# Patient Record
Sex: Male | Born: 1980 | Race: White | Hispanic: No | Marital: Single | Smoking: Former smoker
Health system: Southern US, Community
[De-identification: ages and names within clinical notes are randomized; demographics above are authoritative.]

## PROBLEM LIST (undated history)

## (undated) DIAGNOSIS — F419 Anxiety disorder, unspecified: Secondary | ICD-10-CM

## (undated) DIAGNOSIS — F32A Depression, unspecified: Secondary | ICD-10-CM

## (undated) DIAGNOSIS — F329 Major depressive disorder, single episode, unspecified: Secondary | ICD-10-CM

## (undated) DIAGNOSIS — F319 Bipolar disorder, unspecified: Secondary | ICD-10-CM

## (undated) DIAGNOSIS — I8390 Asymptomatic varicose veins of unspecified lower extremity: Secondary | ICD-10-CM

## (undated) HISTORY — DX: Asymptomatic varicose veins of unspecified lower extremity: I83.90

## (undated) HISTORY — DX: Bipolar disorder, unspecified: F31.9

## (undated) HISTORY — DX: Anxiety disorder, unspecified: F41.9

## (undated) HISTORY — DX: Depression, unspecified: F32.A

## (undated) HISTORY — PX: VARICOSE VEIN SURGERY: SHX832

## (undated) HISTORY — PX: KNEE SURGERY: SHX244

## (undated) HISTORY — DX: Major depressive disorder, single episode, unspecified: F32.9

---
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
## 2006-10-02 ENCOUNTER — Emergency Department: Payer: Self-pay | Admitting: Emergency Medicine

## 2007-04-11 ENCOUNTER — Other Ambulatory Visit: Payer: Self-pay

## 2007-04-11 ENCOUNTER — Emergency Department: Payer: Self-pay | Admitting: Emergency Medicine

## 2008-09-03 ENCOUNTER — Emergency Department: Payer: Self-pay | Admitting: Emergency Medicine
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
## 2011-10-09 ENCOUNTER — Ambulatory Visit (INDEPENDENT_AMBULATORY_CARE_PROVIDER_SITE_OTHER): Payer: 59 | Admitting: Psychiatry

## 2011-10-09 ENCOUNTER — Encounter (HOSPITAL_COMMUNITY): Payer: Self-pay | Admitting: Psychiatry

## 2011-10-09 VITALS — BP 130/90 | HR 82 | Wt 258.0 lb

## 2011-10-09 DIAGNOSIS — F3111 Bipolar disorder, current episode manic without psychotic features, mild: Secondary | ICD-10-CM | POA: Insufficient documentation

## 2011-10-09 DIAGNOSIS — F4310 Post-traumatic stress disorder, unspecified: Secondary | ICD-10-CM

## 2011-10-09 DIAGNOSIS — F1994 Other psychoactive substance use, unspecified with psychoactive substance-induced mood disorder: Secondary | ICD-10-CM

## 2011-10-09 DIAGNOSIS — F1910 Other psychoactive substance abuse, uncomplicated: Secondary | ICD-10-CM | POA: Insufficient documentation

## 2011-10-09 MED ORDER — ARIPIPRAZOLE 2 MG PO TABS
ORAL_TABLET | ORAL | Status: DC
Start: 2011-10-09 — End: 2011-10-23
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
## 2011-10-23 ENCOUNTER — Encounter (HOSPITAL_COMMUNITY): Payer: Self-pay | Admitting: Psychiatry

## 2011-10-23 ENCOUNTER — Ambulatory Visit (INDEPENDENT_AMBULATORY_CARE_PROVIDER_SITE_OTHER): Payer: 59 | Admitting: Psychiatry

## 2011-10-23 VITALS — Wt 264.0 lb

## 2011-10-23 DIAGNOSIS — F1910 Other psychoactive substance abuse, uncomplicated: Secondary | ICD-10-CM

## 2011-10-23 DIAGNOSIS — F988 Other specified behavioral and emotional disorders with onset usually occurring in childhood and adolescence: Secondary | ICD-10-CM

## 2011-10-23 DIAGNOSIS — F1994 Other psychoactive substance use, unspecified with psychoactive substance-induced mood disorder: Secondary | ICD-10-CM

## 2011-10-23 DIAGNOSIS — F319 Bipolar disorder, unspecified: Secondary | ICD-10-CM

## 2011-10-23 DIAGNOSIS — F3111 Bipolar disorder, current episode manic without psychotic features, mild: Secondary | ICD-10-CM

## 2011-10-23 MED ORDER — ATOMOXETINE HCL 18 MG PO CAPS: 18.0000 mg | ORAL_CAPSULE | Freq: Every day | ORAL | Status: DC

## 2011-10-23 MED ORDER — ARIPIPRAZOLE 5 MG PO TABS: 5.0000 mg | ORAL_TABLET | Freq: Two times a day (BID) | ORAL | Status: DC
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
## 2011-11-13 ENCOUNTER — Ambulatory Visit (HOSPITAL_COMMUNITY): Payer: Self-pay | Admitting: Psychiatry

## 2011-11-17 ENCOUNTER — Telehealth (HOSPITAL_COMMUNITY): Payer: Self-pay | Admitting: *Deleted

## 2011-11-17 ENCOUNTER — Other Ambulatory Visit (HOSPITAL_COMMUNITY): Payer: Self-pay | Admitting: Psychiatry

## 2011-11-18 ENCOUNTER — Other Ambulatory Visit (HOSPITAL_COMMUNITY): Payer: Self-pay | Admitting: Psychiatry

## 2011-11-18 NOTE — Telephone Encounter (Signed)
Call returned and left a message 

## 2011-11-25 ENCOUNTER — Ambulatory Visit (HOSPITAL_COMMUNITY): Payer: Self-pay | Admitting: Psychiatry

## 2011-12-03 ENCOUNTER — Ambulatory Visit (HOSPITAL_COMMUNITY): Payer: Self-pay | Admitting: Psychology

## 2012-03-15 ENCOUNTER — Telehealth (HOSPITAL_COMMUNITY): Payer: Self-pay

## 2012-10-12 DIAGNOSIS — F1020 Alcohol dependence, uncomplicated: Secondary | ICD-10-CM | POA: Insufficient documentation

## 2012-10-12 DIAGNOSIS — F1420 Cocaine dependence, uncomplicated: Secondary | ICD-10-CM | POA: Insufficient documentation

## 2015-05-30 ENCOUNTER — Ambulatory Visit (INDEPENDENT_AMBULATORY_CARE_PROVIDER_SITE_OTHER): Payer: 59 | Admitting: Family Medicine

## 2015-05-30 ENCOUNTER — Encounter: Payer: Self-pay | Admitting: Family Medicine

## 2015-05-30 VITALS — BP 120/80 | HR 80 | Ht 70.0 in | Wt 251.0 lb

## 2015-05-30 DIAGNOSIS — Z0000 Encounter for general adult medical examination without abnormal findings: Secondary | ICD-10-CM | POA: Diagnosis not present

## 2015-05-30 DIAGNOSIS — E663 Overweight: Secondary | ICD-10-CM | POA: Diagnosis not present

## 2015-05-30 DIAGNOSIS — F3111 Bipolar disorder, current episode manic without psychotic features, mild: Secondary | ICD-10-CM | POA: Diagnosis not present

## 2015-05-30 NOTE — Progress Notes (Signed)
Name: Edward Long   MRN: [id number]    DOB: 09-02-1980   Date:05/30/2015       Progress Note  Subjective  Chief Complaint  Chief Complaint  Patient presents with  . Establish Care  . Referral    needs referral to psych- been off med x 2 1/2 years    HPI Comments: Patient presents for annual physical exam.     No problem-specific assessment & plan notes found for this encounter.   Past Medical History  Diagnosis Date  . Varicose vein     Past Surgical History  Procedure Laterality Date  . Knee surgery    . Varicose vein surgery      Family History  Problem Relation Age of Onset  . Anxiety disorder Mother   . Bipolar disorder Father   . Anxiety disorder Brother     Social History   Social History  . Marital Status: Single    Spouse Name: N/A  . Number of Children: N/A  . Years of Education: N/A   Occupational History  . Not on file.   Social History Main Topics  . Smoking status: Current Some Day Smoker  . Smokeless tobacco: Not on file  . Alcohol Use: Yes  . Drug Use: Yes  . Sexual Activity: Not on file   Other Topics Concern  . Not on file   Social History Narrative    Allergies  Allergen Reactions  . Penicillins Rash     Review of Systems  Constitutional: Negative for fever, chills, weight loss and malaise/fatigue.  HENT: Negative for ear discharge, ear pain and sore throat.   Eyes: Negative for blurred vision.  Respiratory: Negative for cough, sputum production, shortness of breath and wheezing.   Cardiovascular: Negative for chest pain, palpitations and leg swelling.  Gastrointestinal: Negative for heartburn, nausea, abdominal pain, diarrhea, constipation, blood in stool and melena.  Genitourinary: Negative for dysuria, urgency, frequency and hematuria.  Musculoskeletal: Negative for myalgias, back pain, joint pain and neck pain.  Skin: Negative for rash.  Neurological: Negative for dizziness, tingling, sensory change, focal weakness  and headaches.  Endo/Heme/Allergies: Negative for environmental allergies and polydipsia. Does not bruise/bleed easily.  Psychiatric/Behavioral: Negative for depression and suicidal ideas. The patient is not nervous/anxious and does not have insomnia.      Objective  Filed Vitals:   05/30/15 0928  BP: 120/80  Pulse: 80  Height: 5\' 10"  (1.778 m)  Weight: 251 lb (113.853 kg)    Physical Exam  Constitutional: He is oriented to person, place, and time and well-developed, well-nourished, and in no distress.  HENT:  Head: Normocephalic.  Right Ear: Tympanic membrane, external ear and ear canal normal.  Left Ear: Tympanic membrane, external ear and ear canal normal.  Nose: Nose normal.  Mouth/Throat: Uvula is midline, oropharynx is clear and moist and mucous membranes are normal.  Eyes: Conjunctivae and EOM are normal. Pupils are equal, round, and reactive to light. Right eye exhibits no discharge. Left eye exhibits no discharge. No scleral icterus.  Neck: Normal range of motion. Neck supple. No JVD present. No tracheal deviation present. No thyromegaly present.  Cardiovascular: Normal rate, regular rhythm, normal heart sounds and intact distal pulses.  Exam reveals no gallop and no friction rub.   No murmur heard. Pulmonary/Chest: Breath sounds normal. No respiratory distress. He has no wheezes. He has no rales. Right breast exhibits no inverted nipple, no mass, no nipple discharge, no skin change and no tenderness. Left breast  exhibits no inverted nipple, no mass, no nipple discharge, no skin change and no tenderness. Breasts are symmetrical.  Abdominal: Soft. Bowel sounds are normal. He exhibits no mass. There is no hepatosplenomegaly. There is no tenderness. There is no rebound, no guarding and no CVA tenderness.  Genitourinary: Testes/scrotum normal.  Musculoskeletal: Normal range of motion. He exhibits no edema or tenderness.  Lymphadenopathy:       Head (right side): No submental and  no submandibular adenopathy present.       Head (left side): No submental and no submandibular adenopathy present.    He has no cervical adenopathy.  Neurological: He is alert and oriented to person, place, and time. He has normal sensation, normal strength, normal reflexes and intact cranial nerves. No cranial nerve deficit.  Skin: Skin is warm. No rash noted.  Psychiatric: Affect normal. His mood appears anxious. His affect is not blunt, not labile and not inappropriate. He is not agitated. He does not express impulsivity. He does not exhibit a depressed mood. He expresses no homicidal and no suicidal ideation. He expresses no suicidal plans and no homicidal plans. He is not apathetic. He exhibits disordered thought content. He does not have a flat affect.  Nursing note and vitals reviewed.     Assessment & Plan  Problem List Items Addressed This Visit      Other   Bipolar 1 disorder, manic, mild (HCC)   Relevant Orders   Ambulatory referral to Psychiatry    Other Visit Diagnoses    Annual physical exam    -  Primary    Relevant Orders    Renal Function Panel    Lipid Profile    Overweight        Relevant Orders    Renal Function Panel    Lipid Profile         Dr. Elizabeth Sauer [facility]  05/30/2015

## 2015-05-31 LAB — RENAL FUNCTION PANEL
ALBUMIN: 4 g/dL (ref 3.5–5.5)
BUN / CREAT RATIO: 12 (ref 9–20)
BUN: 12 mg/dL (ref 6–20)
CALCIUM: 9 mg/dL (ref 8.7–10.2)
CHLORIDE: 101 mmol/L (ref 96–106)
CO2: 26 mmol/L (ref 18–29)
Creatinine, Ser: 1.01 mg/dL (ref 0.76–1.27)
GFR calc non Af Amer: 98 mL/min/{1.73_m2} (ref >59)
GFR, EST AFRICAN AMERICAN: 113 mL/min/{1.73_m2} (ref >59)
GLUCOSE: 98 mg/dL (ref 65–99)
POTASSIUM: 4.2 mmol/L (ref 3.5–5.2)
Phosphorus: 3 mg/dL (ref 2.5–4.5)
Sodium: 140 mmol/L (ref 134–144)

## 2015-05-31 LAB — LIPID PANEL
CHOLESTEROL TOTAL: 135 mg/dL (ref 100–199)
Chol/HDL Ratio: 2.6 ratio units (ref 0.0–5.0)
HDL: 51 mg/dL (ref >39)
LDL Calculated: 74 mg/dL (ref 0–99)
Triglycerides: 51 mg/dL (ref 0–149)
VLDL Cholesterol Cal: 10 mg/dL (ref 5–40)

## 2015-06-29 ENCOUNTER — Encounter: Payer: Self-pay | Admitting: Psychiatry

## 2015-06-29 ENCOUNTER — Ambulatory Visit (INDEPENDENT_AMBULATORY_CARE_PROVIDER_SITE_OTHER): Payer: 59 | Admitting: Psychiatry

## 2015-06-29 VITALS — BP 110/78 | HR 75 | Temp 97.3°F | Ht 70.0 in | Wt 254.0 lb

## 2015-06-29 DIAGNOSIS — E669 Obesity, unspecified: Secondary | ICD-10-CM | POA: Insufficient documentation

## 2015-06-29 DIAGNOSIS — F3160 Bipolar disorder, current episode mixed, unspecified: Secondary | ICD-10-CM

## 2015-06-29 DIAGNOSIS — F1414 Cocaine abuse with cocaine-induced mood disorder: Secondary | ICD-10-CM | POA: Diagnosis not present

## 2015-06-29 DIAGNOSIS — F988 Other specified behavioral and emotional disorders with onset usually occurring in childhood and adolescence: Secondary | ICD-10-CM | POA: Insufficient documentation

## 2015-06-29 DIAGNOSIS — F32A Depression, unspecified: Secondary | ICD-10-CM | POA: Insufficient documentation

## 2015-06-29 DIAGNOSIS — F1010 Alcohol abuse, uncomplicated: Secondary | ICD-10-CM

## 2015-06-29 DIAGNOSIS — F329 Major depressive disorder, single episode, unspecified: Secondary | ICD-10-CM | POA: Insufficient documentation

## 2015-06-29 MED ORDER — LITHIUM CARBONATE 300 MG PO TABS: ORAL_TABLET | ORAL | Status: DC

## 2015-06-29 MED ORDER — BUSPIRONE HCL 5 MG PO TABS: 5.0000 mg | ORAL_TABLET | Freq: Two times a day (BID) | ORAL | Status: DC
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
## 2015-07-13 ENCOUNTER — Encounter: Payer: Self-pay | Admitting: Psychiatry

## 2015-07-13 ENCOUNTER — Ambulatory Visit (INDEPENDENT_AMBULATORY_CARE_PROVIDER_SITE_OTHER): Payer: 59 | Admitting: Psychiatry

## 2015-07-13 VITALS — BP 124/86 | HR 84 | Temp 97.2°F | Ht 70.0 in | Wt 253.2 lb

## 2015-07-13 DIAGNOSIS — F1414 Cocaine abuse with cocaine-induced mood disorder: Secondary | ICD-10-CM

## 2015-07-13 DIAGNOSIS — F3160 Bipolar disorder, current episode mixed, unspecified: Secondary | ICD-10-CM | POA: Diagnosis not present

## 2015-07-13 DIAGNOSIS — F1010 Alcohol abuse, uncomplicated: Secondary | ICD-10-CM

## 2015-07-13 MED ORDER — BUSPIRONE HCL 10 MG PO TABS: 10.0000 mg | ORAL_TABLET | Freq: Two times a day (BID) | ORAL | Status: DC

## 2015-07-13 MED ORDER — LITHIUM CARBONATE 300 MG PO TABS: 300.0000 mg | ORAL_TABLET | Freq: Two times a day (BID) | ORAL | Status: DC
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
## 2015-07-27 ENCOUNTER — Encounter: Payer: Self-pay | Admitting: Psychiatry

## 2015-07-27 ENCOUNTER — Ambulatory Visit (INDEPENDENT_AMBULATORY_CARE_PROVIDER_SITE_OTHER): Payer: 59 | Admitting: Licensed Clinical Social Worker

## 2015-07-27 ENCOUNTER — Ambulatory Visit (INDEPENDENT_AMBULATORY_CARE_PROVIDER_SITE_OTHER): Payer: 59 | Admitting: Psychiatry

## 2015-07-27 VITALS — BP 118/84 | HR 87 | Temp 97.2°F | Ht 70.0 in | Wt 253.6 lb

## 2015-07-27 DIAGNOSIS — F3160 Bipolar disorder, current episode mixed, unspecified: Secondary | ICD-10-CM

## 2015-07-27 DIAGNOSIS — F429 Obsessive-compulsive disorder, unspecified: Secondary | ICD-10-CM

## 2015-07-27 DIAGNOSIS — F1414 Cocaine abuse with cocaine-induced mood disorder: Secondary | ICD-10-CM | POA: Diagnosis not present

## 2015-07-27 MED ORDER — LITHIUM CARBONATE ER 300 MG PO TBCR: 300.0000 mg | EXTENDED_RELEASE_TABLET | Freq: Three times a day (TID) | ORAL | Status: DC

## 2015-07-27 MED ORDER — QUETIAPINE FUMARATE 100 MG PO TABS: 100.0000 mg | ORAL_TABLET | Freq: Every day | ORAL | Status: DC

## 2015-07-27 MED ORDER — BUSPIRONE HCL 10 MG PO TABS: 10.0000 mg | ORAL_TABLET | Freq: Three times a day (TID) | ORAL | Status: DC
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
## 2015-08-24 ENCOUNTER — Ambulatory Visit: Payer: 59 | Admitting: Psychiatry

## 2015-08-24 ENCOUNTER — Ambulatory Visit: Payer: 59 | Admitting: Licensed Clinical Social Worker

## 2015-12-14 ENCOUNTER — Encounter: Payer: Self-pay | Admitting: Psychiatry

## 2015-12-14 ENCOUNTER — Ambulatory Visit (INDEPENDENT_AMBULATORY_CARE_PROVIDER_SITE_OTHER): Payer: 59 | Admitting: Psychiatry

## 2015-12-14 VITALS — BP 118/83 | HR 64 | Temp 98.4°F | Wt 239.4 lb

## 2015-12-14 DIAGNOSIS — F1414 Cocaine abuse with cocaine-induced mood disorder: Secondary | ICD-10-CM

## 2015-12-14 MED ORDER — QUETIAPINE FUMARATE 25 MG PO TABS: 25.0000 mg | ORAL_TABLET | Freq: Every day | ORAL | 1 refills | Status: DC

## 2015-12-14 MED ORDER — QUETIAPINE FUMARATE 100 MG PO TABS: 100.0000 mg | ORAL_TABLET | Freq: Every day | ORAL | 1 refills | Status: DC
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
## 2015-12-19 ENCOUNTER — Ambulatory Visit: Payer: 59 | Admitting: Licensed Clinical Social Worker

## 2015-12-20 ENCOUNTER — Ambulatory Visit: Payer: 59 | Admitting: Licensed Clinical Social Worker

## 2015-12-24 ENCOUNTER — Ambulatory Visit: Payer: 59 | Admitting: Licensed Clinical Social Worker

## 2016-04-22 ENCOUNTER — Other Ambulatory Visit: Payer: Self-pay | Admitting: Orthopedic Surgery

## 2016-04-22 DIAGNOSIS — M25561 Pain in right knee: Principal | ICD-10-CM

## 2016-04-22 DIAGNOSIS — G8929 Other chronic pain: Secondary | ICD-10-CM

## 2016-04-22 DIAGNOSIS — M2351 Chronic instability of knee, right knee: Secondary | ICD-10-CM

## 2016-04-22 DIAGNOSIS — M1711 Unilateral primary osteoarthritis, right knee: Secondary | ICD-10-CM

## 2016-04-22 DIAGNOSIS — Z9889 Other specified postprocedural states: Secondary | ICD-10-CM

## 2016-05-02 ENCOUNTER — Ambulatory Visit: Admission: RE | Admit: 2016-05-02 | Payer: Commercial Managed Care - PPO | Source: Ambulatory Visit

## 2016-09-13 ENCOUNTER — Emergency Department: Payer: Commercial Managed Care - PPO

## 2016-09-13 ENCOUNTER — Encounter: Payer: Self-pay | Admitting: Emergency Medicine

## 2016-09-13 DIAGNOSIS — G44019 Episodic cluster headache, not intractable: Secondary | ICD-10-CM | POA: Diagnosis not present

## 2016-09-13 DIAGNOSIS — F17220 Nicotine dependence, chewing tobacco, uncomplicated: Secondary | ICD-10-CM | POA: Insufficient documentation

## 2016-09-13 DIAGNOSIS — F430 Acute stress reaction: Secondary | ICD-10-CM | POA: Insufficient documentation

## 2016-09-13 DIAGNOSIS — R51 Headache: Secondary | ICD-10-CM | POA: Diagnosis present

## 2016-09-13 NOTE — ED Triage Notes (Signed)
Patient with complaint of intermittent headache times one year. Patient reports that tonight he started having numbness to his jaw and neck that started around 17:00 tonight. Patient states that the numbness has mostly resolved at this time.

## 2016-09-13 NOTE — ED Triage Notes (Signed)
Pt says he's been having headaches about 3 times a week for over one year; pain is always to the right temple to above right ear; pt says today he also had numbness to his jaw but that has resolved; pt adds he's been having strange thoughts, not bad thoughts like hurting himself or someone else; just thoughts about things he hears or things that bother him that usually wouldn't; pt would like a head CT

## 2016-09-14 ENCOUNTER — Emergency Department
Admission: EM | Admit: 2016-09-14 | Discharge: 2016-09-14 | Disposition: A | Discharge status: Home / Self Care | Payer: Commercial Managed Care - PPO | Attending: Emergency Medicine | Admitting: Emergency Medicine

## 2016-09-14 DIAGNOSIS — F439 Reaction to severe stress, unspecified: Secondary | ICD-10-CM

## 2016-09-14 DIAGNOSIS — G44019 Episodic cluster headache, not intractable: Secondary | ICD-10-CM

## 2016-09-14 LAB — CBC WITH DIFFERENTIAL/PLATELET
Basophils Absolute: 0.1 10*3/uL (ref 0–0.1)
Basophils Relative: 1 %
EOS PCT: 3 %
Eosinophils Absolute: 0.2 10*3/uL (ref 0–0.7)
HEMATOCRIT: 43.9 % (ref 40.0–52.0)
Hemoglobin: 15.2 g/dL (ref 13.0–18.0)
LYMPHS ABS: 2.9 10*3/uL (ref 1.0–3.6)
LYMPHS PCT: 41 %
MCH: 30 pg (ref 26.0–34.0)
MCHC: 34.5 g/dL (ref 32.0–36.0)
MCV: 86.9 fL (ref 80.0–100.0)
MONO ABS: 0.8 10*3/uL (ref 0.2–1.0)
MONOS PCT: 12 %
NEUTROS ABS: 3.1 10*3/uL (ref 1.4–6.5)
Neutrophils Relative %: 43 %
PLATELETS: 157 10*3/uL (ref 150–440)
RBC: 5.06 MIL/uL (ref 4.40–5.90)
RDW: 13.2 % (ref 11.5–14.5)
WBC: 7.1 10*3/uL (ref 3.8–10.6)

## 2016-09-14 LAB — URINALYSIS, COMPLETE (UACMP) WITH MICROSCOPIC
BACTERIA UA: NONE SEEN
BILIRUBIN URINE: NEGATIVE
GLUCOSE, UA: NEGATIVE mg/dL
Hgb urine dipstick: NEGATIVE
Ketones, ur: NEGATIVE mg/dL
LEUKOCYTES UA: NEGATIVE
Nitrite: NEGATIVE
PH: 5 (ref 5.0–8.0)
Protein, ur: NEGATIVE mg/dL
SPECIFIC GRAVITY, URINE: 1.027 (ref 1.005–1.030)
Squamous Epithelial / LPF: NONE SEEN

## 2016-09-14 LAB — BASIC METABOLIC PANEL
Anion gap: 9 (ref 5–15)
BUN: 17 mg/dL (ref 6–20)
CALCIUM: 8.9 mg/dL (ref 8.9–10.3)
CO2: 25 mmol/L (ref 22–32)
Chloride: 106 mmol/L (ref 101–111)
Creatinine, Ser: 1.02 mg/dL (ref 0.61–1.24)
GFR calc Af Amer: >60 mL/min (ref >60)
GLUCOSE: 104 mg/dL — AB (ref 65–99)
POTASSIUM: 3.4 mmol/L — AB (ref 3.5–5.1)
Sodium: 140 mmol/L (ref 135–145)

## 2016-09-14 LAB — GLUCOSE, CAPILLARY: Glucose-Capillary: 110 mg/dL — ABNORMAL HIGH (ref 65–99)

## 2016-09-14 MED ORDER — LORAZEPAM 1 MG PO TABS: 1.0000 mg | ORAL_TABLET | Freq: Three times a day (TID) | ORAL | 0 refills | Status: DC | PRN

## 2016-09-14 NOTE — Discharge Instructions (Signed)
1. You may take Ativan (#15) as needed for stress and anxiety. 2. Return to the ER for worsening symptoms, persistent vomiting, difficulty breathing or other concerns.

## 2016-09-14 NOTE — ED Notes (Signed)
Pt waiting patiently for treatment room; says feeling a little better, pain 3/10

## 2016-09-14 NOTE — ED Notes (Signed)
Patient transported to CT 

## 2016-09-14 NOTE — ED Provider Notes (Signed)
[facility] Emergency Department Provider Note   ____________________________________________   First MD Initiated Contact with Patient 09/14/16 0501     (approximate)  I have reviewed the triage vital signs and the nursing notes.   HISTORY  Chief Complaint Headache    HPI Edward Long is a 36 y.o. male who presents to the ED from home with a chief complaint of headache. Patient reports intermittent headache for over one year. Always right-sided, brief in nature, associated with right eye redness and tearing, and mainly associated with increased stress and anxiety. Reports increased stress at work recently. Had some jaw numbness yesterday which resolved without intervention. Denies associated vision changes, neck pain, chest pain, shortness of breath, abdominal pain, nausea, vomiting, dysuria. Also wants to be checked for diabetes because he has been drinking more and being more thirsty recently. States he has been clean and sober from cocaine for the past 5 months. Has not tried anything for his headache over the past year.   Past Medical History:  Diagnosis Date  . Anxiety   . Bipolar disorder (HCC)   . Depression   . Varicose vein     Patient Active Problem List   Diagnosis Date Noted  . Other specified behavioral and emotional disorders with onset usually occurring in childhood and adolescence 06/29/2015  . Clinical depression 06/29/2015  . Adiposity 06/29/2015  . Cocaine dependence (HCC) 10/12/2012  . Alcohol dependence (HCC) 10/12/2012  . Substance abuse 10/09/2011  . Bipolar 1 disorder, manic, mild (HCC) 10/09/2011    Past Surgical History:  Procedure Laterality Date  . KNEE SURGERY    . VARICOSE VEIN SURGERY      Prior to Admission medications   Not on File    Allergies Penicillins  Family History  Problem Relation Age of Onset  . Anxiety disorder Mother   . Bipolar disorder Father   . Anxiety disorder Brother   . Bipolar  disorder Sister   None for migraines None for cerebral aneurysm  Social History Social History  Substance Use Topics  . Smoking status: Former Smoker    Types: Cigarettes  . Smokeless tobacco: Current User    Types: Snuff  . Alcohol use No     Comment: denies this visit    Review of Systems  Constitutional: No fever/chills. Eyes: No visual changes. ENT: No sore throat. Cardiovascular: Denies chest pain. Respiratory: Denies shortness of breath. Gastrointestinal: No abdominal pain.  No nausea, no vomiting.  No diarrhea.  No constipation. Genitourinary: Negative for dysuria. Musculoskeletal: Negative for back pain. Skin: Negative for rash. Neurological: Positive for headaches. Negative for focal weakness or numbness. Psychiatric:Positive for increased stress and anxiety. Denies SI/HI/AH/VH.  ____________________________________________   PHYSICAL EXAM:  VITAL SIGNS: ED Triage Vitals  Enc Vitals Group     BP 09/13/16 2336 (!) 146/103     Pulse Rate 09/13/16 2336 65     Resp 09/13/16 2336 18     Temp 09/13/16 2336 98.5 F (36.9 C)     Temp Source 09/13/16 2336 Oral     SpO2 09/13/16 2336 96 %     Weight 09/13/16 2337 250 lb (113.4 kg)     Height 09/13/16 2337 5\' 10"  (1.778 m)     Head Circumference --      Peak Flow --      Pain Score 09/13/16 2336 4     Pain Loc --      Pain Edu? --  Excl. in GC? --     Constitutional: Alert and oriented. Well appearing and in no acute distress. Eyes: Conjunctivae are normal. PERRL. EOMI. Head: Atraumatic. Sinuses nontender to palpation. Nose: No congestion/rhinnorhea. Mouth/Throat: Mucous membranes are moist.  Oropharynx non-erythematous. Neck: No stridor.  Supple neck without meningismus.  No carotid bruits. Cardiovascular: Normal rate, regular rhythm. Grossly normal heart sounds.  Good peripheral circulation. Respiratory: Normal respiratory effort.  No retractions. Lungs CTAB. Gastrointestinal: Soft and nontender. No  distention. No abdominal bruits. No CVA tenderness. Musculoskeletal: No lower extremity tenderness nor edema.  No joint effusions. Neurologic:  Normal speech and language. No gross focal neurologic deficits are appreciated. No gait instability. Skin:  Skin is warm, dry and intact. No rash noted. No petechiae. Psychiatric: Mood and affect are normal. Speech and behavior are normal.  ____________________________________________   LABS (all labs ordered are listed, but only abnormal results are displayed)  Labs Reviewed  URINALYSIS, COMPLETE (UACMP) WITH MICROSCOPIC - Abnormal; Notable for the following:       Result Value   Color, Urine YELLOW (*)    APPearance CLEAR (*)    All other components within normal limits  GLUCOSE, CAPILLARY - Abnormal; Notable for the following:    Glucose-Capillary 110 (*)    All other components within normal limits  CBC WITH DIFFERENTIAL/PLATELET  BASIC METABOLIC PANEL   ____________________________________________  EKG  None ____________________________________________  RADIOLOGY  Ct Head Wo Contrast  Result Date: 09/14/2016 CLINICAL DATA:  Headache and facial numbness EXAM: CT HEAD WITHOUT CONTRAST TECHNIQUE: Contiguous axial images were obtained from the base of the skull through the vertex without intravenous contrast. COMPARISON:  None. FINDINGS: Brain: No mass lesion, intraparenchymal hemorrhage or extra-axial collection. No evidence of acute cortical infarct. There is a left posterior fossa arachnoid cyst. Vascular: No hyperdense vessel or unexpected calcification. Skull: Normal visualized skull base, calvarium and extracranial soft tissues. Sinuses/Orbits: No sinus fluid levels or advanced mucosal thickening. No mastoid effusion. Normal orbits. IMPRESSION: No acute intracranial abnormality or finding to explain the reported headache and facial numbness. Electronically Signed   By: Deatra RobinsonKevin  Herman M.D.   On: 09/14/2016 00:14     ____________________________________________   PROCEDURES  Procedure(s) performed: None  Procedures  Critical Care performed: No  ____________________________________________   INITIAL IMPRESSION / ASSESSMENT AND PLAN / ED COURSE  Pertinent labs & imaging results that were available during my care of the patient were reviewed by me and considered in my medical decision making (see chart for details).  36 year old male who presents with headaches over 1 year. Clinically patient is describing cluster headaches. In speaking with patient, it seems that stress and anxiety are a huge part of his life and he would benefit from anxiolytics. Patient and girlfriend concerned that his urine is "always dark". Will check kidney function. Patient currently does not have a headache.  Clinical Course as of Sep 15 807  Wynelle LinkSun Sep 14, 2016  0630 Updated patient of unremarkable lab work. Will discharge home with limited prescription for Ativan and he will follow-up with both his PCP as well as neurology. Strict return precautions given. Patient verbalizes understanding and agrees with plan of care.  [JS]    Clinical Course User Index [JS] Irean HongSung, Jade J, MD     ____________________________________________   FINAL CLINICAL IMPRESSION(S) / ED DIAGNOSES  Final diagnoses:  Episodic cluster headache, not intractable  Stress      NEW MEDICATIONS STARTED DURING THIS VISIT:  New Prescriptions   No medications  on file     Note:  This document was prepared using Dragon voice recognition software and may include unintentional dictation errors.    Irean Hong, MD 09/14/16 [phone]

## 2016-09-15 ENCOUNTER — Other Ambulatory Visit: Payer: Self-pay

## 2016-09-18 ENCOUNTER — Ambulatory Visit (INDEPENDENT_AMBULATORY_CARE_PROVIDER_SITE_OTHER): Payer: Commercial Managed Care - PPO | Admitting: Family Medicine

## 2016-09-18 ENCOUNTER — Encounter: Payer: Self-pay | Admitting: Family Medicine

## 2016-09-18 VITALS — BP 120/70 | HR 68 | Ht 70.0 in | Wt 281.0 lb

## 2016-09-18 DIAGNOSIS — Z6841 Body Mass Index (BMI) 40.0 and over, adult: Secondary | ICD-10-CM | POA: Diagnosis not present

## 2016-09-18 DIAGNOSIS — F419 Anxiety disorder, unspecified: Secondary | ICD-10-CM | POA: Diagnosis not present

## 2016-09-18 DIAGNOSIS — F3111 Bipolar disorder, current episode manic without psychotic features, mild: Secondary | ICD-10-CM

## 2016-09-18 DIAGNOSIS — F422 Mixed obsessional thoughts and acts: Secondary | ICD-10-CM | POA: Insufficient documentation

## 2016-09-18 MED ORDER — SERTRALINE HCL 50 MG PO TABS: 50.0000 mg | ORAL_TABLET | Freq: Every day | ORAL | 3 refills | Status: AC

## 2016-09-18 NOTE — Progress Notes (Addendum)
Name: Edward Long   MRN: [id number]    DOB: May 07, 1980   Date:09/18/2016       Progress Note  Subjective  Chief Complaint  Chief Complaint  Patient presents with  . Follow-up    anxiety attacks/ can't concentrate/ feel like "I'm shivering inside"- doesn't want to go back to psychiatrist    Anxiety  Presents for initial visit. Onset was more than 5 years ago. The problem has been waxing and waning. Symptoms include compulsions, decreased concentration, depressed mood, excessive worry, irritability, nervous/anxious behavior, obsessions and panic. Patient reports no chest pain, confusion, dizziness, feeling of choking, hyperventilation, insomnia, malaise, muscle tension, nausea, palpitations, restlessness, shortness of breath or suicidal ideas. Symptoms occur most days. The severity of symptoms is moderate. The quality of sleep is good.   His past medical history is significant for anxiety/panic attacks. There is no history of suicide attempts. Past treatments include benzodiazephines. Compliance with prior treatments has been good.    No problem-specific Assessment & Plan notes found for this encounter.   Past Medical History:  Diagnosis Date  . Anxiety   . Bipolar disorder (HCC)   . Depression   . Varicose vein     Past Surgical History:  Procedure Laterality Date  . KNEE SURGERY    . VARICOSE VEIN SURGERY      Family History  Problem Relation Age of Onset  . Anxiety disorder Mother   . Bipolar disorder Father   . Anxiety disorder Brother   . Bipolar disorder Sister     Social History   Social History  . Marital status: Single    Spouse name: N/A  . Number of children: N/A  . Years of education: N/A   Occupational History  . Not on file.   Social History Main Topics  . Smoking status: Former Smoker    Types: Cigarettes  . Smokeless tobacco: Current User    Types: Snuff  . Alcohol use No     Comment: denies this visit  . Drug use: No     Comment: no drug  use for 5 months  . Sexual activity: Not Currently    Birth control/ protection: None   Other Topics Concern  . Not on file   Social History Narrative  . No narrative on file    Allergies  Allergen Reactions  . Penicillins Rash    Outpatient Medications Prior to Visit  Medication Sig Dispense Refill  . LORazepam (ATIVAN) 1 MG tablet Take 1 tablet (1 mg total) by mouth every 8 (eight) hours as needed for anxiety. 15 tablet 0   No facility-administered medications prior to visit.     Review of Systems  Constitutional: Positive for irritability. Negative for chills, fever, malaise/fatigue and weight loss.  HENT: Negative for ear discharge, ear pain and sore throat.   Eyes: Negative for blurred vision.  Respiratory: Negative for cough, sputum production, shortness of breath and wheezing.   Cardiovascular: Negative for chest pain, palpitations and leg swelling.  Gastrointestinal: Negative for abdominal pain, blood in stool, constipation, diarrhea, heartburn, melena and nausea.  Genitourinary: Negative for dysuria, frequency, hematuria and urgency.  Musculoskeletal: Negative for back pain, joint pain, myalgias and neck pain.  Skin: Negative for rash.  Neurological: Negative for dizziness, tingling, sensory change, focal weakness and headaches.  Endo/Heme/Allergies: Negative for environmental allergies and polydipsia. Does not bruise/bleed easily.  Psychiatric/Behavioral: Positive for decreased concentration. Negative for confusion, depression and suicidal ideas. The patient is nervous/anxious. The patient does  not have insomnia.      Objective  Vitals:   09/18/16 1517  BP: 120/70  Pulse: 68  Weight: 281 lb (127.5 kg)  Height: 5\' 10"  (1.778 m)    Physical Exam  Constitutional: He is oriented to person, place, and time and well-developed, well-nourished, and in no distress.  HENT:  Head: Normocephalic.  Right Ear: External ear normal.  Left Ear: External ear normal.   Nose: Nose normal.  Mouth/Throat: Oropharynx is clear and moist.  Eyes: Pupils are equal, round, and reactive to light. Conjunctivae and EOM are normal. Right eye exhibits no discharge. Left eye exhibits no discharge. No scleral icterus.  Neck: Normal range of motion. Neck supple. No JVD present. No tracheal deviation present. No thyromegaly present.  Cardiovascular: Normal rate, regular rhythm, normal heart sounds and intact distal pulses.  Exam reveals no gallop and no friction rub.   No murmur heard. Pulmonary/Chest: Breath sounds normal. No respiratory distress. He has no wheezes. He has no rales.  Abdominal: Soft. Bowel sounds are normal. He exhibits no mass. There is no hepatosplenomegaly. There is no tenderness. There is no rebound, no guarding and no CVA tenderness.  Musculoskeletal: Normal range of motion. He exhibits no edema or tenderness.  Lymphadenopathy:    He has no cervical adenopathy.  Neurological: He is alert and oriented to person, place, and time. He has normal sensation, normal strength, normal reflexes and intact cranial nerves. No cranial nerve deficit.  Skin: Skin is warm. No rash noted.  Psychiatric: Mood and affect normal.  Nursing note and vitals reviewed.     Assessment & Plan  Problem List Items Addressed This Visit      Other   Bipolar 1 disorder, manic, mild (HCC)   Relevant Orders   Ambulatory referral to Psychiatry   Adiposity   Relevant Medications   sertraline (ZOLOFT) 50 MG tablet   Anxiety - Primary   Relevant Medications   sertraline (ZOLOFT) 50 MG tablet   Other Relevant Orders   Ambulatory referral to Psychiatry   Mixed obsessional thoughts and acts   Relevant Medications   sertraline (ZOLOFT) 50 MG tablet   Other Relevant Orders   Ambulatory referral to Psychiatry      Meds ordered this encounter  Medications  . sertraline (ZOLOFT) 50 MG tablet    Sig: Take 1 tablet (50 mg total) by mouth daily.    Dispense:  30 tablet     Refill:  3      Dr. Hayden Rasmusseneanna Roback [facility]  09/18/16

## 2016-09-18 NOTE — Patient Instructions (Signed)
Generalized Anxiety Disorder, Adult Generalized anxiety disorder (GAD) is a mental health disorder. People with this condition constantly worry about everyday events. Unlike normal anxiety, worry related to GAD is not triggered by a specific event. These worries also do not fade or get better with time. GAD interferes with life functions, including relationships, work, and school. GAD can vary from mild to severe. People with severe GAD can have intense waves of anxiety with physical symptoms (panic attacks). What are the causes? The exact cause of GAD is not known. What increases the risk? This condition is more likely to develop in:  Women.  People who have a family history of anxiety disorders.  People who are very shy.  People who experience very stressful life events, such as the death of a loved one.  People who have a very stressful family environment.  What are the signs or symptoms? People with GAD often worry excessively about many things in their lives, such as their health and family. They may also be overly concerned about:  Doing well at work.  Being on time.  Natural disasters.  Friendships.  Physical symptoms of GAD include:  Fatigue.  Muscle tension or having muscle twitches.  Trembling or feeling shaky.  Being easily startled.  Feeling like your heart is pounding or racing.  Feeling out of breath or like you cannot take a deep breath.  Having trouble falling asleep or staying asleep.  Sweating.  Nausea, diarrhea, or irritable bowel syndrome (IBS).  Headaches.  Trouble concentrating or remembering facts.  Restlessness.  Irritability.  How is this diagnosed? Your health care provider can diagnose GAD based on your symptoms and medical history. You will also have a physical exam. The health care provider will ask specific questions about your symptoms, including how severe they are, when they started, and if they come and go. Your health care  provider may ask you about your use of alcohol or drugs, including prescription medicines. Your health care provider may refer you to a mental health specialist for further evaluation. Your health care provider will do a thorough examination and may perform additional tests to rule out other possible causes of your symptoms. To be diagnosed with GAD, a person must have anxiety that:  Is out of his or her control.  Affects several different aspects of his or her life, such as work and relationships.  Causes distress that makes him or her unable to take part in normal activities.  Includes at least three physical symptoms of GAD, such as restlessness, fatigue, trouble concentrating, irritability, muscle tension, or sleep problems.  Before your health care provider can confirm a diagnosis of GAD, these symptoms must be present more days than they are not, and they must last for six months or longer. How is this treated? The following therapies are usually used to treat GAD:  Medicine. Antidepressant medicine is usually prescribed for long-term daily control. Antianxiety medicines may be added in severe cases, especially when panic attacks occur.  Talk therapy (psychotherapy). Certain types of talk therapy can be helpful in treating GAD by providing support, education, and guidance. Options include: ? Cognitive behavioral therapy (CBT). People learn coping skills and techniques to ease their anxiety. They learn to identify unrealistic or negative thoughts and behaviors and to replace them with positive ones. ? Acceptance and commitment therapy (ACT). This treatment teaches people how to be mindful as a way to cope with unwanted thoughts and feelings. ? Biofeedback. This process trains you to   manage your body's response (physiological response) through breathing techniques and relaxation methods. You will work with a therapist while machines are used to monitor your physical symptoms.  Stress  management techniques. These include yoga, meditation, and exercise.  A mental health specialist can help determine which treatment is best for you. Some people see improvement with one type of therapy. However, other people require a combination of therapies. Follow these instructions at home:  Take over-the-counter and prescription medicines only as told by your health care provider.  Try to maintain a normal routine.  Try to anticipate stressful situations and allow extra time to manage them.  Practice any stress management or self-calming techniques as taught by your health care provider.  Do not punish yourself for setbacks or for not making progress.  Try to recognize your accomplishments, even if they are small.  Keep all follow-up visits as told by your health care provider. This is important. Contact a health care provider if:  Your symptoms do not get better.  Your symptoms get worse.  You have signs of depression, such as: ? A persistently sad, cranky, or irritable mood. ? Loss of enjoyment in activities that used to bring you joy. ? Change in weight or eating. ? Changes in sleeping habits. ? Avoiding friends or family members. ? Loss of energy for normal tasks. ? Feelings of guilt or worthlessness. Get help right away if:  You have serious thoughts about hurting yourself or others. If you ever feel like you may hurt yourself or others, or have thoughts about taking your own life, get help right away. You can go to your nearest emergency department or call:  Your local emergency services (911 in the [place]  A suicide crisis helpline, such as the National Suicide Prevention Lifeline at 289-741-05111-315-656-7275. This is open 24 hours a day.  Summary  Generalized anxiety disorder (GAD) is a mental health disorder that involves worry that is not triggered by a specific event.  People with GAD often worry excessively about many things in their lives, such as their health and  family.  GAD may cause physical symptoms such as restlessness, trouble concentrating, sleep problems, frequent sweating, nausea, diarrhea, headaches, and trembling or muscle twitching.  A mental health specialist can help determine which treatment is best for you. Some people see improvement with one type of therapy. However, other people require a combination of therapies. This information is not intended to replace advice given to you by your health care provider. Make sure you discuss any questions you have with your health care provider. Document Released: 06/07/2012 Document Revised: 01/01/2016 Document Reviewed: 01/01/2016 Elsevier Interactive Patient Education  2018 [employer] Obsessive-Compulsive Disorder Obsessive-compulsive disorder (OCD) is a brain-based anxiety disorder. People with OCD have obsessions, compulsions, or both. Obsessions are unwanted and distressing thoughts, ideas, or urges that keep entering your mind and result in anxiety. You may find yourself trying to ignore them. You may try to stop or undo them with a compulsion. Compulsions are repetitive physical or mental acts that you feel you have to do. They may reduce or prevent any emotional distress, but in most instances, they are ineffective. Compulsions can be very time-consuming, often taking more than one hour each day. They can interfere with personal relationships and normal activities at home, school, or work. OCD can begin in childhood, but it usually starts in young adulthood and continues throughout life. Many people with OCD also have depression or another mental health disorder. What are the  causes? The cause of this condition is not known. What increases the risk? This condition is more like to develop in:  People who have experienced trauma.  People who have a family history of OCD.  Women during and after pregnancy.  People who have infections and post-infectious autoimmune syndrome.  People who  have other mental health conditions.  People who abuse substances.  What are the signs or symptoms? Symptoms of OCD include compulsions and obsessions. People with obsessions usually have a fear that something terrible will happen or that they will do something terrible. Examples of common obsessions include:  Fear of contamination with germs, waste, or poisonous substances.  Fear of making the wrong decision.  Violent or sexual thoughts or urges towards others.  Need for symmetry or exactness.  Examples of common compulsions include:  Excessive handwashing or bathing due to fear of contamination.  Checking things over and over to make sure you finished a task, such as making sure you locked a door or unplugged a toaster.  Repeating an act or phrase over and over, sometimes a specific number of times, until it feels right.  Arranging and rearranging objects to keep them in a certain order.  Having a very hard time making a decision and sticking to it.  How is this diagnosed? OCD is diagnosed through an assessment by your health care provider. Your health care provider will ask questions about any obsessions or compulsions you have and how they affect your life. Your health care provider may also ask about your medical history, prescription medicines, and drug use. Certain medical conditions and substances can cause symptoms that are similar to OCD. Your health care provider may also refer you to a mental health specialist. How is this treated? Treatment may include:  Cognitive therapy. This is a form of talk therapy. The goal is to identify and change the irrational thoughts associated with obsessions.  Behavioral therapy. A type of behavioral therapy called exposure and response prevention is often used. In this therapy, you will be exposed to the distressing situation that triggers your compulsion and be prevented from responding to it. With repetition of this process over time,  you will no longer feel the distress or need to perform the compulsion.  Self-soothing. Meditation, deep breathing, or yoga can help you manage the physiological symptoms of anxiety and can help with how you think.  Medicine. Certain types of antidepressant medicine may help reduce or control OCD symptoms. Medicine is most effective when used with cognitive or behavioral therapy.  Treatment usually involves a combination of therapy and medicines. For severe OCD that does not respond to talk therapy and medicine, brain surgery or electrical stimulation of specific areas of the brain (deep brain stimulation) may be considered. Follow these instructions at home:  Take over-the-counter and prescription medicines only as told by your health care provider. Do not start taking any new medicines with approval from your health care provider.  Consider joining a support group for people with OCD.  Keep all follow-up visits as told by your health care provider. This is important. Contact a health care provider if:  You are not able to take your medicines as prescribed.  Your symptoms get worse. Get help right away if:  You have suicidal thoughts or thoughts about hurting yourself or others. If you ever feel like you may hurt yourself or others, or have thoughts about taking your own life, get help right away. You can go to your nearest  emergency department or call:  Your local emergency services (911 in the [place]  A suicide crisis helpline, such as the National Suicide Prevention Lifeline at 718-029-7656. This is open 24-hours a day.  Summary  Obsessive-compulsive disorder (OCD) is a brain-based anxiety disorder. People with OCD have obsessions, compulsions, or both.  OCD can interfere with personal relationships and normal activities at home, school, or work.  Treatment usually involves a combination of therapy and medicines. This information is not intended to replace advice given to you  by your health care provider. Make sure you discuss any questions you have with your health care provider. Document Released: 02/04/2001 Document Revised: 11/26/2015 Document Reviewed: 11/26/2015 Elsevier Interactive Patient Education  2018 [employer].

## 2016-09-18 NOTE — Addendum Note (Signed)
Addended by: Everitt AmberLYNCH, Goldie Tregoning L on: 09/18/2016 04:02 PM   Modules accepted: Orders

## 2016-10-09 ENCOUNTER — Other Ambulatory Visit: Payer: Self-pay

## 2016-10-31 ENCOUNTER — Ambulatory Visit: Payer: Self-pay | Admitting: Family Medicine

## 2018-03-29 ENCOUNTER — Encounter: Payer: Self-pay | Admitting: Emergency Medicine

## 2018-03-29 ENCOUNTER — Other Ambulatory Visit: Payer: Self-pay

## 2018-03-29 ENCOUNTER — Emergency Department
Admission: EM | Admit: 2018-03-29 | Discharge: 2018-03-29 | Disposition: A | Discharge status: Home / Self Care | Payer: Commercial Managed Care - PPO | Attending: Student in an Organized Health Care Education/Training Program | Admitting: Student in an Organized Health Care Education/Training Program

## 2018-03-29 DIAGNOSIS — T405X1A Poisoning by cocaine, accidental (unintentional), initial encounter: Secondary | ICD-10-CM | POA: Diagnosis not present

## 2018-03-29 DIAGNOSIS — R0602 Shortness of breath: Secondary | ICD-10-CM | POA: Insufficient documentation

## 2018-03-29 DIAGNOSIS — Z79899 Other long term (current) drug therapy: Secondary | ICD-10-CM | POA: Insufficient documentation

## 2018-03-29 DIAGNOSIS — F17228 Nicotine dependence, chewing tobacco, with other nicotine-induced disorders: Secondary | ICD-10-CM | POA: Insufficient documentation

## 2018-03-29 DIAGNOSIS — F1410 Cocaine abuse, uncomplicated: Secondary | ICD-10-CM | POA: Diagnosis not present

## 2018-03-29 DIAGNOSIS — F1210 Cannabis abuse, uncomplicated: Secondary | ICD-10-CM | POA: Insufficient documentation

## 2018-03-29 DIAGNOSIS — R451 Restlessness and agitation: Secondary | ICD-10-CM | POA: Diagnosis present

## 2018-03-29 LAB — BASIC METABOLIC PANEL
ANION GAP: 9 (ref 5–15)
BUN: 27 mg/dL — ABNORMAL HIGH (ref 6–20)
CALCIUM: 8.7 mg/dL — AB (ref 8.9–10.3)
CO2: 23 mmol/L (ref 22–32)
Chloride: 107 mmol/L (ref 98–111)
Creatinine, Ser: 1.45 mg/dL — ABNORMAL HIGH (ref 0.61–1.24)
GFR calc Af Amer: >60 mL/min (ref >60)
GFR calc non Af Amer: >60 mL/min (ref >60)
GLUCOSE: 126 mg/dL — AB (ref 70–99)
Potassium: 3.6 mmol/L (ref 3.5–5.1)
Sodium: 139 mmol/L (ref 135–145)

## 2018-03-29 LAB — CBC WITH DIFFERENTIAL/PLATELET
Abs Immature Granulocytes: 0.06 10*3/uL (ref 0.00–0.07)
Basophils Absolute: 0 10*3/uL (ref 0.0–0.1)
Basophils Relative: 0 %
EOS ABS: 0 10*3/uL (ref 0.0–0.5)
EOS PCT: 0 %
HEMATOCRIT: 45 % (ref 39.0–52.0)
Hemoglobin: 15.2 g/dL (ref 13.0–17.0)
Immature Granulocytes: 0 %
LYMPHS ABS: 1.6 10*3/uL (ref 0.7–4.0)
Lymphocytes Relative: 10 %
MCH: 30.2 pg (ref 26.0–34.0)
MCHC: 33.8 g/dL (ref 30.0–36.0)
MCV: 89.3 fL (ref 80.0–100.0)
Monocytes Absolute: 1.9 10*3/uL — ABNORMAL HIGH (ref 0.1–1.0)
Monocytes Relative: 11 %
Neutro Abs: 13 10*3/uL — ABNORMAL HIGH (ref 1.7–7.7)
Neutrophils Relative %: 79 %
Platelets: 207 10*3/uL (ref 150–400)
RBC: 5.04 MIL/uL (ref 4.22–5.81)
RDW: 13.4 % (ref 11.5–15.5)
WBC: 16.5 10*3/uL — ABNORMAL HIGH (ref 4.0–10.5)
nRBC: 0 % (ref 0.0–0.2)

## 2018-03-29 MED ORDER — SODIUM CHLORIDE 0.9 % IV BOLUS
1000.0000 mL | Freq: Once | INTRAVENOUS | Status: AC
  Administered 2018-03-29: 1000 mL via INTRAVENOUS

## 2018-03-29 NOTE — ED Provider Notes (Signed)
[facility] Emergency Department Provider Note    First MD Initiated Contact with Patient 03/29/18 1052     (approximate)  I have reviewed the triage vital signs and the nursing notes.   HISTORY  Chief Complaint Drug Overdose  Level V Caveat:  Ams, cocaine ingestion  HPI Edward Long is a 38 y.o. male below listed past medical history presents the ER after ingesting over 3 g of cocaine over the past 12 hours.  Patient found by EMS agitated hypothermic pouring cold water on himself.  States that he feels "sick ".  Denies any intent for self-harm.  Very anxious appearing.  Feels short of breath.  States he is having trouble sitting still.  Denies any headache.  States he is got a long history of cocaine abuse.  Denies any other substance ingestions.    Past Medical History:  Diagnosis Date  . Anxiety   . Bipolar disorder (HCC)   . Depression   . Varicose vein    Family History  Problem Relation Age of Onset  . Anxiety disorder Mother   . Bipolar disorder Father   . Anxiety disorder Brother   . Bipolar disorder Sister    Past Surgical History:  Procedure Laterality Date  . KNEE SURGERY    . VARICOSE VEIN SURGERY     Patient Active Problem List   Diagnosis Date Noted  . Anxiety 09/18/2016  . Mixed obsessional thoughts and acts 09/18/2016  . Other specified behavioral and emotional disorders with onset usually occurring in childhood and adolescence 06/29/2015  . Clinical depression 06/29/2015  . Adiposity 06/29/2015  . Cocaine dependence (HCC) 10/12/2012  . Alcohol dependence (HCC) 10/12/2012  . Substance abuse (HCC) 10/09/2011  . Bipolar 1 disorder, manic, mild (HCC) 10/09/2011      Prior to Admission medications   Medication Sig Start Date End Date Taking? Authorizing Provider  ALPRAZolam Prudy Feeler(XANAX) 0.5 MG tablet Take 0.5 mg by mouth daily. 03/16/18  Yes [provider]  amantadine (SYMMETREL) 100 MG capsule Take 200 mg by mouth  daily. 03/23/18  Yes [provider]  divalproex (DEPAKOTE) 500 MG DR tablet Take 1,500 mg by mouth daily. 03/23/18  Yes [provider]  FLUoxetine (PROZAC) 20 MG capsule Take 80 mg by mouth daily. 03/21/18  Yes [provider]  folic acid (FOLVITE) 1 MG tablet Take 3 mg by mouth daily. 03/21/18  Yes [provider]  sertraline (ZOLOFT) 50 MG tablet Take 1 tablet (50 mg total) by mouth daily. Patient not taking: Reported on 03/29/2018 09/18/16   Duanne LimerickJones, Deanna C, MD    Allergies Penicillins    Social History Social History   Tobacco Use  . Smoking status: Former Smoker    Types: Cigarettes  . Smokeless tobacco: Current User    Types: Snuff  Substance Use Topics  . Alcohol use: No    Alcohol/week: 0.0 - 7.0 standard drinks    Comment: denies this visit  . Drug use: No    Types: Cocaine, Marijuana    Comment: no drug use for 5 months    Review of Systems Patient denies headaches, rhinorrhea, blurry vision, numbness, shortness of breath, chest pain, edema, cough, abdominal pain, nausea, vomiting, diarrhea, dysuria, fevers, rashes or hallucinations unless otherwise stated above in HPI. ____________________________________________   PHYSICAL EXAM:  VITAL SIGNS: Vitals:   03/29/18 1336 03/29/18 1345  BP:  (!) 121/59  Pulse: 80 81  Resp:  16  Temp:  SpO2: 95% 95%    Constitutional: Alert, intoxicated and agitated appearing, clothes are saturated  Eyes: Conjunctivae are normal.  Head: Atraumatic. Nose: No congestion/rhinnorhea. Mouth/Throat: Mucous membranes are moist.   Neck: No stridor. Painless ROM.  Cardiovascular: Normal rate, regular rhythm. Grossly normal heart sounds.  Good peripheral circulation. Respiratory: Normal respiratory effort.  No retractions. Lungs CTAB. Gastrointestinal: Soft and nontender. No distention. No abdominal bruits. No CVA tenderness. Genitourinary:  Musculoskeletal: No lower extremity tenderness nor edema.   No joint effusions. Neurologic:  Normal speech and language. No gross focal neurologic deficits are appreciated. No facial droop Skin:  Skin is warm, dry and intact. No rash noted. Psychiatric: Mood and affect are normal. Speech and behavior are normal.  ____________________________________________   LABS (all labs ordered are listed, but only abnormal results are displayed)  Results for orders placed or performed during the hospital encounter of 03/29/18 (from the past 24 hour(s))  CBC with Differential/Platelet     Status: Abnormal   Collection Time: 03/29/18 10:58 AM  Result Value Ref Range   WBC 16.5 (H) 4.0 - 10.5 K/uL   RBC 5.04 4.22 - 5.81 MIL/uL   Hemoglobin 15.2 13.0 - 17.0 g/dL   HCT 13.0 86.5 - 78.4 %   MCV 89.3 80.0 - 100.0 fL   MCH 30.2 26.0 - 34.0 pg   MCHC 33.8 30.0 - 36.0 g/dL   RDW 69.6 29.5 - 28.4 %   Platelets 207 150 - 400 K/uL   nRBC 0.0 0.0 - 0.2 %   Neutrophils Relative % 79 %   Neutro Abs 13.0 (H) 1.7 - 7.7 K/uL   Lymphocytes Relative 10 %   Lymphs Abs 1.6 0.7 - 4.0 K/uL   Monocytes Relative 11 %   Monocytes Absolute 1.9 (H) 0.1 - 1.0 K/uL   Eosinophils Relative 0 %   Eosinophils Absolute 0.0 0.0 - 0.5 K/uL   Basophils Relative 0 %   Basophils Absolute 0.0 0.0 - 0.1 K/uL   Immature Granulocytes 0 %   Abs Immature Granulocytes 0.06 0.00 - 0.07 K/uL  Basic metabolic panel     Status: Abnormal   Collection Time: 03/29/18 10:58 AM  Result Value Ref Range   Sodium 139 135 - 145 mmol/L   Potassium 3.6 3.5 - 5.1 mmol/L   Chloride 107 98 - 111 mmol/L   CO2 23 22 - 32 mmol/L   Glucose, Bld 126 (H) 70 - 99 mg/dL   BUN 27 (H) 6 - 20 mg/dL   Creatinine, Ser 1.32 (H) 0.61 - 1.24 mg/dL   Calcium 8.7 (L) 8.9 - 10.3 mg/dL   GFR calc non Af Amer >60 >60 mL/min   GFR calc Af Amer >60 >60 mL/min   Anion gap 9 5 - 15   ____________________________________________  EKG My review and personal interpretation at Time: 10:52   Indication: od  Rate: 100  Rhythm:  sinus Axis: lead reversal. Other: no stemi, normal intervals, ____________________________________________  RADIOLOGY  I personally reviewed all radiographic images ordered to evaluate for the above acute complaints and reviewed radiology reports and findings.  These findings were personally discussed with the patient.  Please see medical record for radiology report.  ____________________________________________   PROCEDURES  Procedure(s) performed:  Procedures    Critical Care performed: no ____________________________________________   INITIAL IMPRESSION / ASSESSMENT AND PLAN / ED COURSE  Pertinent labs & imaging results that were available during my care of the patient were reviewed by me and considered in my  medical decision making (see chart for details).   DDX: Overdose, electrolyte abnormality, agitated delirium.  Edward PrayJustin W Long is a 38 y.o. who presents to the ED with symptoms as described above.  Patient admits to recreational drug use today.  Was given 4 mg of Versed in route with EMS with improvement in symptoms.  He denies any SI or HI.  Feels much improved.  Denies any chest pain.  Clinical Course as of Mar 30 1351  Mon Mar 29, 2018  1151 Patient reassessed and feels much improved.  We will continue to observe.  Denies any chest discomfort or chest pressure.   [PR]  1351 Patient reassessed.  He is been observed in the ER for 3 hours.  At this point he has metabolized his cocaine.  Is asymptomatic.  I do believe he stable and appropriate for outpatient follow-up.  Again he reiterates that this was purely recreational with no intent for self-harm.  Have discussed with the patient and available family all diagnostics and treatments performed thus far and all questions were answered to the best of my ability. The patient demonstrates understanding and agreement with plan.    [PR]    Clinical Course User Index [PR] Willy Eddyobinson, Tishanna Dunford, MD     As part of my medical  decision making, I reviewed the following data within the electronic MEDICAL RECORD NUMBER Nursing notes reviewed and incorporated, Labs reviewed, notes from prior ED visits and [place] Controlled Substance Database   ____________________________________________   FINAL CLINICAL IMPRESSION(S) / ED DIAGNOSES  Final diagnoses:  Cocaine abuse (HCC)      NEW MEDICATIONS STARTED DURING THIS VISIT:  New Prescriptions   No medications on file     Note:  This document was prepared using Dragon voice recognition software and may include unintentional dictation errors.    Willy Eddyobinson, Jonte Shiller, MD 03/29/18 [phone]

## 2018-03-29 NOTE — ED Triage Notes (Signed)
PT from home with OD on aprox 3g of cocaine, pt relapsed after 84months. PT was given 4mg  of versed in route. PT A&OX4, MD at bedside.

## 2018-03-29 NOTE — ED Notes (Signed)

## 2019-01-31 IMAGING — CT CT HEAD W/O CM
3 series · 15 of 47 positions shown, 18 images · non-contrast
Comparison: None.

CLINICAL DATA: Headache and facial numbness

EXAM:
CT HEAD WITHOUT CONTRAST
TECHNIQUE: Contiguous axial images were obtained from the base of the skull
through the vertex without intravenous contrast.

[Series 2: head wo · axial · 0.47mm/px · z∈[-141,-6]mm · 9 of 33 slices shown, 12 images]
[im 3/33  brain]
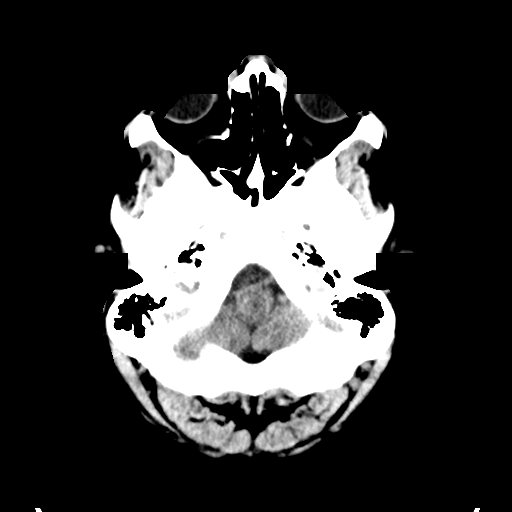
[im 3/33  bone]
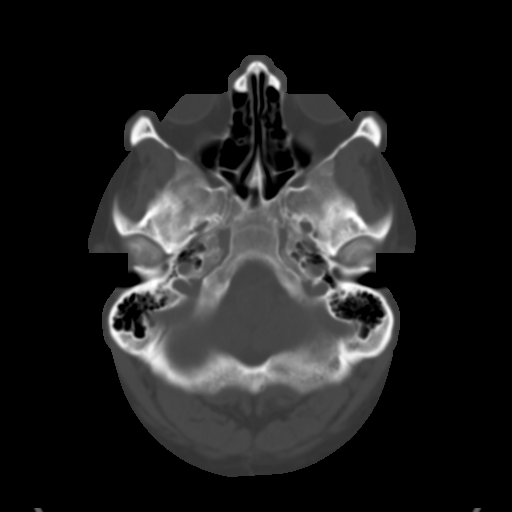
[im 6/33  brain]
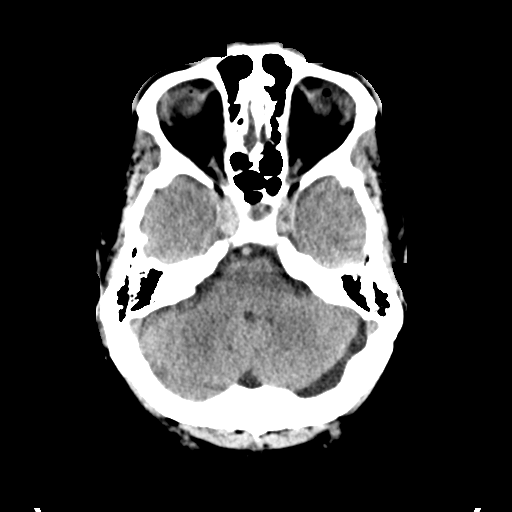
[im 9/33  brain]
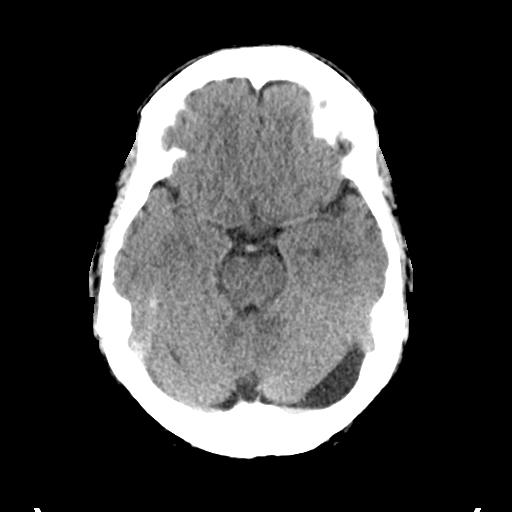
[im 13/33  brain]
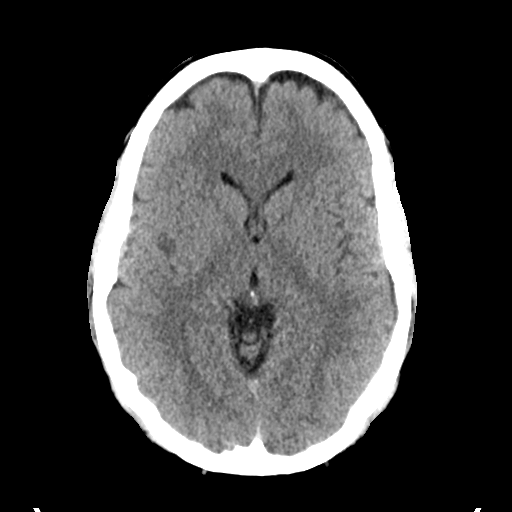
[im 17/33  brain]
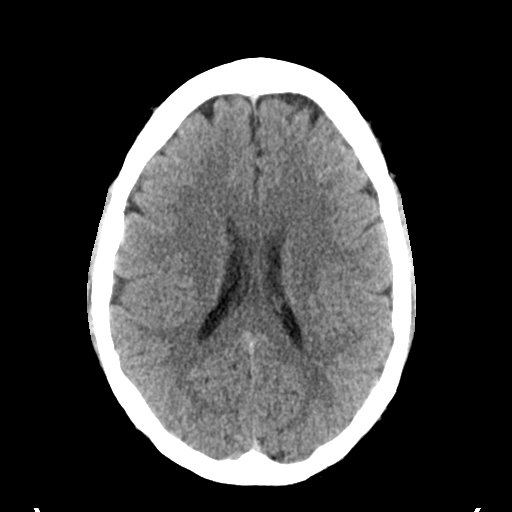
[im 17/33  bone]
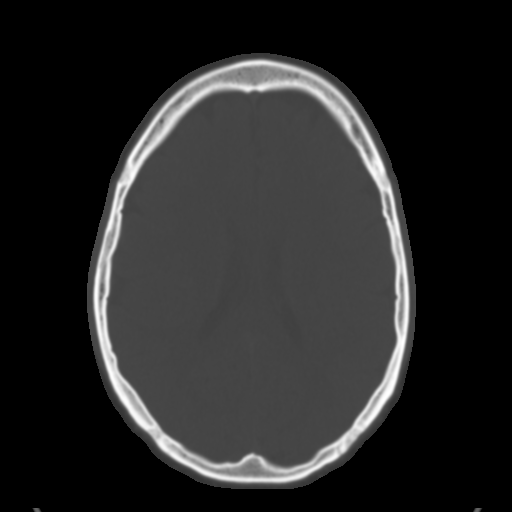
[im 20/33  brain]
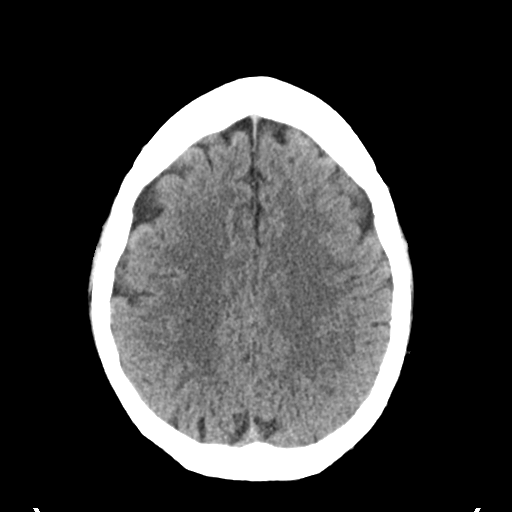
[im 24/33  brain]
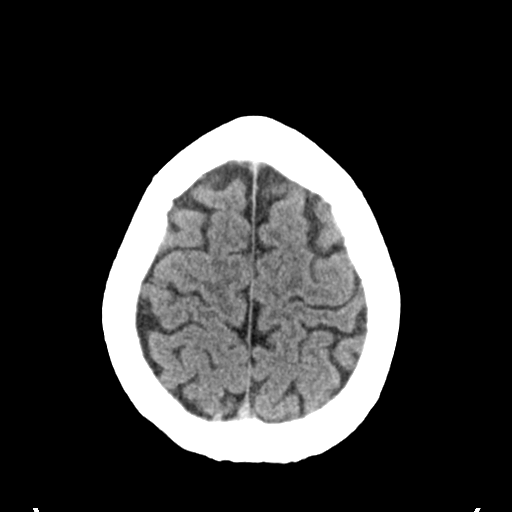
[im 27/33  brain]
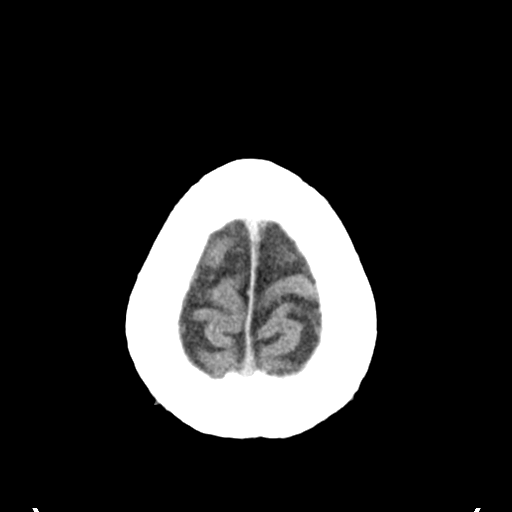
[im 30/33  brain]
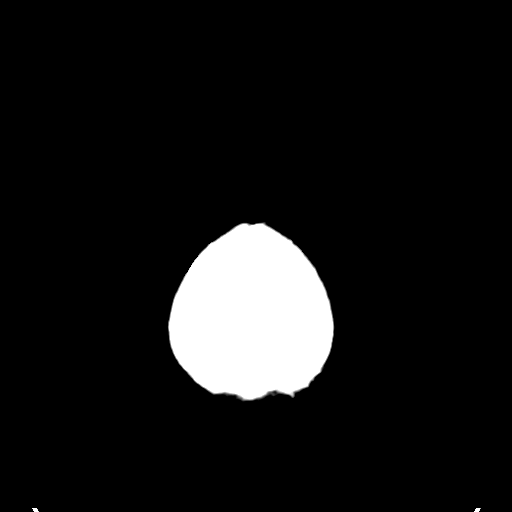
[im 30/33  bone]
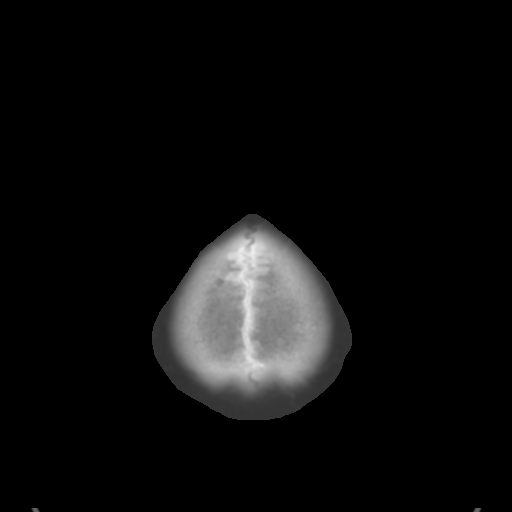

[Series 4: coronal soft tissue · coronal · 0.32mm/px · 3 of 72 slices shown]
[im 24/72  brain]
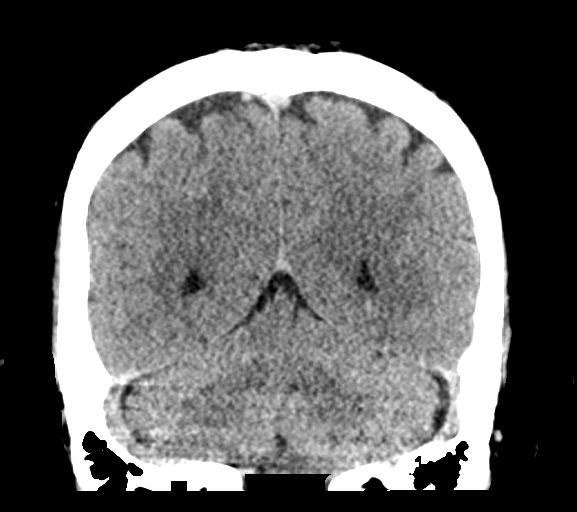
[im 32/72  brain]
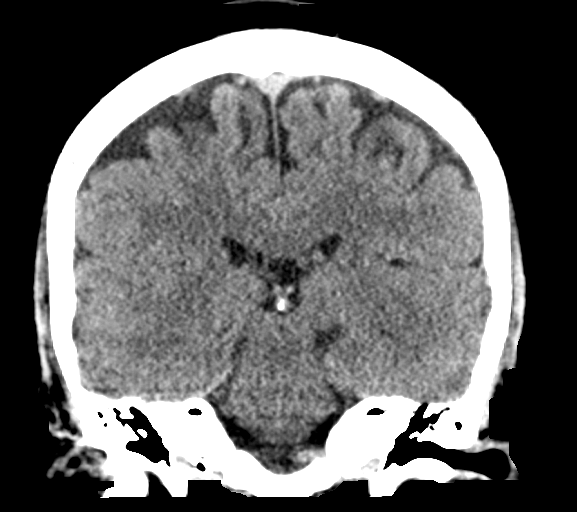
[im 40/72  brain]
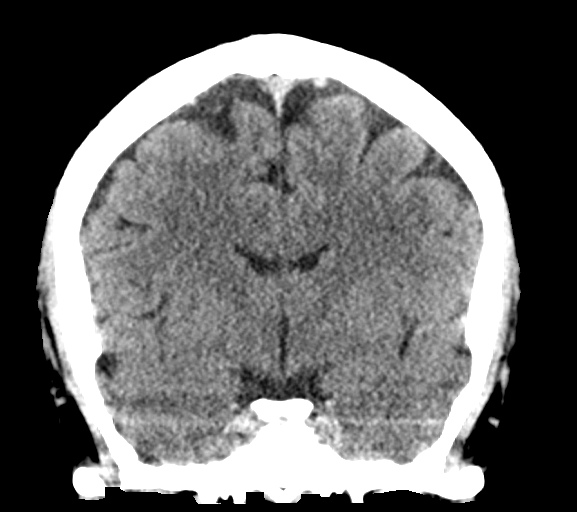

[Series 5: sagittal soft tissue · sagittal · 0.32mm/px · 3 of 58 slices shown]
[im 20/58  brain]
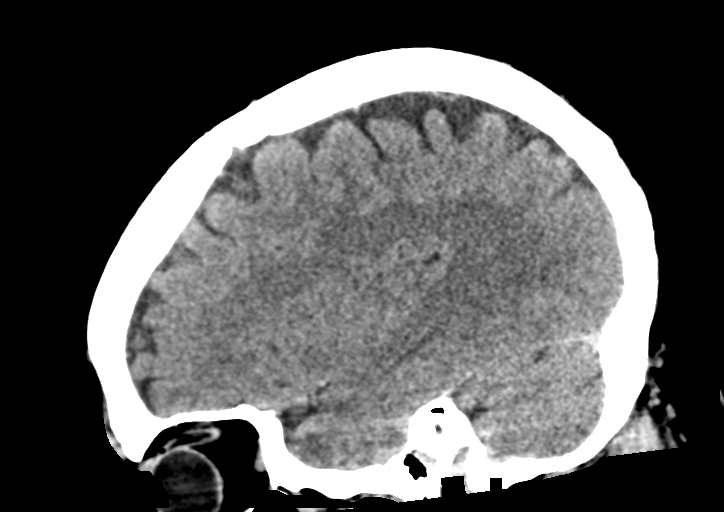
[im 29/58  brain]
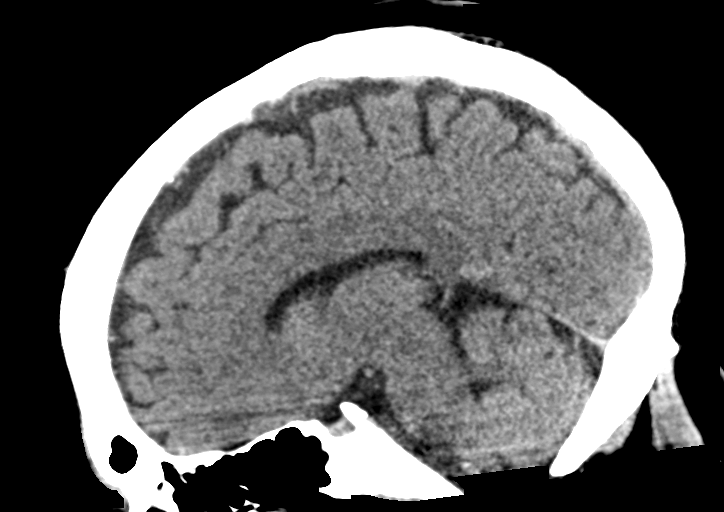
[im 39/58  brain]
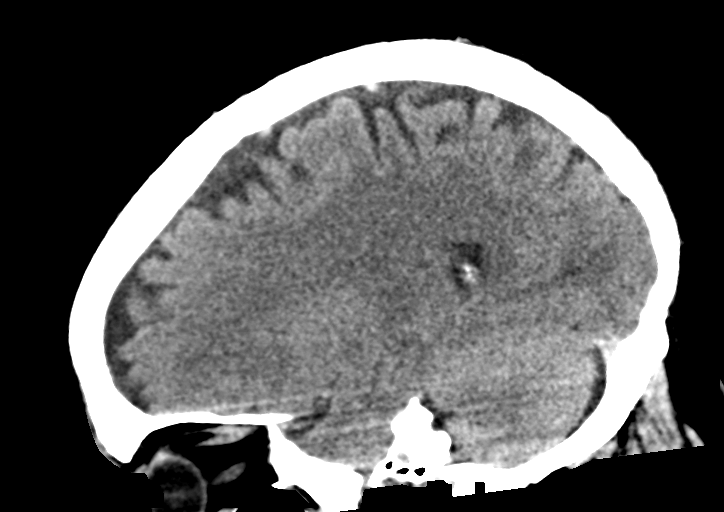

[15 of 47 positions shown; findings below may reference images not displayed]

FINDINGS: Brain: No mass lesion, intraparenchymal hemorrhage or extra-axial
collection. No evidence of acute cortical infarct. There is a left
posterior fossa arachnoid cyst.

Vascular: No hyperdense vessel or unexpected calcification.

Skull: Normal visualized skull base, calvarium and extracranial soft
tissues.

Sinuses/Orbits: No sinus fluid levels or advanced mucosal
thickening. No mastoid effusion. Normal orbits.
IMPRESSION: No acute intracranial abnormality or finding to explain the reported
headache and facial numbness.
